# Patient Record
Sex: Male | Born: 1950 | Race: White | Hispanic: No | Marital: Married
Health system: Southern US, Community
[De-identification: ages and names within clinical notes are randomized; demographics above are authoritative.]

---

## 2019-04-27 ENCOUNTER — Inpatient Hospital Stay
Admission: EM | Admit: 2019-04-27 | Discharge: 2019-05-11 | DRG: 296 | Disposition: E | Payer: Medicare HMO | Attending: Pulmonary Disease | Admitting: Pulmonary Disease

## 2019-04-27 ENCOUNTER — Inpatient Hospital Stay: Payer: Medicare HMO

## 2019-04-27 ENCOUNTER — Emergency Department: Payer: Medicare HMO

## 2019-04-27 ENCOUNTER — Encounter: Admission: EM | Disposition: E | Payer: Self-pay | Source: Home / Self Care | Attending: Pulmonary Disease

## 2019-04-27 DIAGNOSIS — J9602 Acute respiratory failure with hypercapnia: Secondary | ICD-10-CM | POA: Diagnosis present

## 2019-04-27 DIAGNOSIS — I63512 Cerebral infarction due to unspecified occlusion or stenosis of left middle cerebral artery: Secondary | ICD-10-CM | POA: Diagnosis present

## 2019-04-27 DIAGNOSIS — G931 Anoxic brain damage, not elsewhere classified: Secondary | ICD-10-CM | POA: Diagnosis present

## 2019-04-27 DIAGNOSIS — E874 Mixed disorder of acid-base balance: Secondary | ICD-10-CM | POA: Diagnosis present

## 2019-04-27 DIAGNOSIS — I2510 Atherosclerotic heart disease of native coronary artery without angina pectoris: Secondary | ICD-10-CM | POA: Diagnosis present

## 2019-04-27 DIAGNOSIS — N179 Acute kidney failure, unspecified: Secondary | ICD-10-CM | POA: Diagnosis present

## 2019-04-27 DIAGNOSIS — I509 Heart failure, unspecified: Secondary | ICD-10-CM | POA: Diagnosis present

## 2019-04-27 DIAGNOSIS — J9600 Acute respiratory failure, unspecified whether with hypoxia or hypercapnia: Secondary | ICD-10-CM

## 2019-04-27 DIAGNOSIS — Z951 Presence of aortocoronary bypass graft: Secondary | ICD-10-CM

## 2019-04-27 DIAGNOSIS — I959 Hypotension, unspecified: Secondary | ICD-10-CM | POA: Diagnosis present

## 2019-04-27 DIAGNOSIS — Z20822 Contact with and (suspected) exposure to covid-19: Secondary | ICD-10-CM | POA: Diagnosis present

## 2019-04-27 DIAGNOSIS — J9601 Acute respiratory failure with hypoxia: Secondary | ICD-10-CM | POA: Diagnosis present

## 2019-04-27 DIAGNOSIS — E669 Obesity, unspecified: Secondary | ICD-10-CM | POA: Diagnosis present

## 2019-04-27 DIAGNOSIS — I110 Hypertensive heart disease with heart failure: Secondary | ICD-10-CM | POA: Diagnosis present

## 2019-04-27 DIAGNOSIS — Z66 Do not resuscitate: Secondary | ICD-10-CM | POA: Diagnosis present

## 2019-04-27 DIAGNOSIS — I469 Cardiac arrest, cause unspecified: Principal | ICD-10-CM | POA: Diagnosis present

## 2019-04-27 DIAGNOSIS — I213 ST elevation (STEMI) myocardial infarction of unspecified site: Secondary | ICD-10-CM

## 2019-04-27 LAB — BASIC METABOLIC PANEL
Anion gap: 21 — ABNORMAL HIGH (ref 5–15)
BUN: 13 mg/dL (ref 8–23)
CO2: 15 mmol/L — ABNORMAL LOW (ref 22–32)
Calcium: 8.7 mg/dL — ABNORMAL LOW (ref 8.9–10.3)
Chloride: 106 mmol/L (ref 98–111)
Creatinine, Ser: 1.47 mg/dL — ABNORMAL HIGH (ref 0.61–1.24)
GFR calc Af Amer: 56 mL/min — ABNORMAL LOW (ref >60)
GFR calc non Af Amer: 49 mL/min — ABNORMAL LOW (ref >60)
Glucose, Bld: 323 mg/dL — ABNORMAL HIGH (ref 70–99)
Potassium: 4.7 mmol/L (ref 3.5–5.1)
Sodium: 142 mmol/L (ref 135–145)

## 2019-04-27 LAB — APTT: aPTT: 69 seconds — ABNORMAL HIGH (ref 24–36)

## 2019-04-27 LAB — BLOOD GAS, ARTERIAL
Acid-base deficit: 20.9 mmol/L — ABNORMAL HIGH (ref 0.0–2.0)
Bicarbonate: 11.8 mmol/L — ABNORMAL LOW (ref 20.0–28.0)
FIO2: 1
MECHVT: 600 mL
Mechanical Rate: 18
O2 Saturation: 85.8 %
PEEP: 5 cmH2O
Patient temperature: 37
RATE: 18 resp/min
pCO2 arterial: 56 mmHg — ABNORMAL HIGH (ref 32.0–48.0)
pH, Arterial: 6.93 — CL (ref 7.350–7.450)
pO2, Arterial: 83 mmHg (ref 83.0–108.0)

## 2019-04-27 LAB — CBC WITH DIFFERENTIAL/PLATELET
Abs Immature Granulocytes: 1.05 10*3/uL — ABNORMAL HIGH (ref 0.00–0.07)
Basophils Absolute: 0.2 10*3/uL — ABNORMAL HIGH (ref 0.0–0.1)
Basophils Relative: 1 %
Eosinophils Absolute: 0.2 10*3/uL (ref 0.0–0.5)
Eosinophils Relative: 1 %
HCT: 47.6 % (ref 39.0–52.0)
Hemoglobin: 14.3 g/dL (ref 13.0–17.0)
Immature Granulocytes: 7 %
Lymphocytes Relative: 35 %
Lymphs Abs: 5.6 10*3/uL — ABNORMAL HIGH (ref 0.7–4.0)
MCH: 28.8 pg (ref 26.0–34.0)
MCHC: 30 g/dL (ref 30.0–36.0)
MCV: 96 fL (ref 80.0–100.0)
Monocytes Absolute: 1 10*3/uL (ref 0.1–1.0)
Monocytes Relative: 6 %
Neutro Abs: 8 10*3/uL — ABNORMAL HIGH (ref 1.7–7.7)
Neutrophils Relative %: 50 %
Platelets: 217 10*3/uL (ref 150–400)
RBC: 4.96 MIL/uL (ref 4.22–5.81)
RDW: 13.7 % (ref 11.5–15.5)
Smear Review: NORMAL
WBC: 15.7 10*3/uL — ABNORMAL HIGH (ref 4.0–10.5)
nRBC: 0.6 % — ABNORMAL HIGH (ref 0.0–0.2)

## 2019-04-27 LAB — LACTIC ACID, PLASMA: Lactic Acid, Venous: >11 mmol/L (ref 0.5–1.9)

## 2019-04-27 LAB — PROTIME-INR
INR: 2.3 — ABNORMAL HIGH (ref 0.8–1.2)
INR: 2.6 — ABNORMAL HIGH (ref 0.8–1.2)
Prothrombin Time: 25.6 seconds — ABNORMAL HIGH (ref 11.4–15.2)
Prothrombin Time: 27.4 seconds — ABNORMAL HIGH (ref 11.4–15.2)

## 2019-04-27 LAB — TROPONIN I (HIGH SENSITIVITY)
Troponin I (High Sensitivity): 45 ng/L — ABNORMAL HIGH (ref <18)
Troponin I (High Sensitivity): 315 ng/L (ref <18)

## 2019-04-27 LAB — COMPREHENSIVE METABOLIC PANEL
ALT: 32 U/L (ref 0–44)
AST: 52 U/L — ABNORMAL HIGH (ref 15–41)
Albumin: 3.8 g/dL (ref 3.5–5.0)
Alkaline Phosphatase: 105 U/L (ref 38–126)
Anion gap: 19 — ABNORMAL HIGH (ref 5–15)
BUN: 12 mg/dL (ref 8–23)
CO2: 14 mmol/L — ABNORMAL LOW (ref 22–32)
Calcium: 9.1 mg/dL (ref 8.9–10.3)
Chloride: 106 mmol/L (ref 98–111)
Creatinine, Ser: 1.13 mg/dL (ref 0.61–1.24)
GFR calc Af Amer: >60 mL/min (ref >60)
GFR calc non Af Amer: >60 mL/min (ref >60)
Glucose, Bld: 260 mg/dL — ABNORMAL HIGH (ref 70–99)
Potassium: 4.1 mmol/L (ref 3.5–5.1)
Sodium: 139 mmol/L (ref 135–145)
Total Bilirubin: 1.1 mg/dL (ref 0.3–1.2)
Total Protein: 7.3 g/dL (ref 6.5–8.1)

## 2019-04-27 LAB — RESPIRATORY PANEL BY RT PCR (FLU A&B, COVID)
Influenza A by PCR: NEGATIVE
Influenza B by PCR: NEGATIVE
SARS Coronavirus 2 by RT PCR: NEGATIVE

## 2019-04-27 LAB — PROCALCITONIN: Procalcitonin: 2.44 ng/mL

## 2019-04-27 SURGERY — CORONARY/GRAFT ACUTE MI REVASCULARIZATION
Anesthesia: Moderate Sedation

## 2019-04-27 MED ORDER — VANCOMYCIN HCL IN DEXTROSE 1-5 GM/200ML-% IV SOLN: 1000.0000 mg | Freq: Once | INTRAVENOUS | Status: DC

## 2019-04-27 MED ORDER — ASPIRIN 300 MG RE SUPP: 300.0000 mg | RECTAL | Status: DC

## 2019-04-27 MED ORDER — HEPARIN (PORCINE) 25000 UT/250ML-% IV SOLN: 1300.0000 [IU]/h | INTRAVENOUS | Status: DC

## 2019-04-27 MED ORDER — SODIUM CHLORIDE 0.9 % IV SOLN: INTRAVENOUS | Status: DC

## 2019-04-27 MED ORDER — STERILE WATER FOR INJECTION IV SOLN
INTRAVENOUS | Status: DC
  Filled 2019-04-27 (×3): qty 850

## 2019-04-27 MED ORDER — SODIUM BICARBONATE 8.4 % IV SOLN
100.0000 meq | Freq: Once | INTRAVENOUS | Status: AC
  Administered 2019-04-27: 100 meq via INTRAVENOUS
  Filled 2019-04-27: qty 50

## 2019-04-27 MED ORDER — FENTANYL CITRATE (PF) 100 MCG/2ML IJ SOLN
50.0000 ug | Freq: Once | INTRAMUSCULAR | Status: AC
  Administered 2019-04-27: 50 ug via INTRAVENOUS
  Filled 2019-04-27: qty 2

## 2019-04-27 MED ORDER — FENTANYL BOLUS VIA INFUSION
25.0000 ug | INTRAVENOUS | Status: DC | PRN
  Filled 2019-04-27: qty 25

## 2019-04-27 MED ORDER — PIPERACILLIN-TAZOBACTAM 3.375 G IVPB 30 MIN: 3.3750 g | Freq: Once | INTRAVENOUS | Status: DC

## 2019-04-27 MED ORDER — INSULIN REGULAR(HUMAN) IN NACL 100-0.9 UT/100ML-% IV SOLN: INTRAVENOUS | Status: DC

## 2019-04-27 MED ORDER — DEXTROSE 50 % IV SOLN: 0.0000 mL | INTRAVENOUS | Status: DC | PRN

## 2019-04-27 MED ORDER — INSULIN ASPART 100 UNIT/ML ~~LOC~~ SOLN: 0.0000 [IU] | SUBCUTANEOUS | Status: DC

## 2019-04-27 MED ORDER — NOREPINEPHRINE 4 MG/250ML-% IV SOLN
60.0000 ug/min | INTRAVENOUS | Status: DC
  Administered 2019-04-27: 60 ug/min via INTRAVENOUS

## 2019-04-27 MED ORDER — NOREPINEPHRINE 4 MG/250ML-% IV SOLN
INTRAVENOUS | Status: AC
  Filled 2019-04-27: qty 250

## 2019-04-27 MED ORDER — HYDROCORTISONE NA SUCCINATE PF 100 MG IJ SOLR
50.0000 mg | Freq: Four times a day (QID) | INTRAMUSCULAR | Status: DC
  Administered 2019-04-27: 50 mg via INTRAVENOUS
  Filled 2019-04-27: qty 2

## 2019-04-27 MED ORDER — FAMOTIDINE IN NACL 20-0.9 MG/50ML-% IV SOLN: 20.0000 mg | Freq: Two times a day (BID) | INTRAVENOUS | Status: DC

## 2019-04-27 MED ORDER — PROPOFOL 1000 MG/100ML IV EMUL: INTRAVENOUS | Status: DC

## 2019-04-27 MED ORDER — VASOPRESSIN 20 UNIT/ML IV SOLN
0.0300 [IU]/min | INTRAVENOUS | Status: DC
  Administered 2019-04-27: 0.03 [IU]/min via INTRAVENOUS
  Filled 2019-04-27: qty 2

## 2019-04-27 MED ORDER — NOREPINEPHRINE 16 MG/250ML-% IV SOLN
0.0000 ug/min | INTRAVENOUS | Status: DC
  Administered 2019-04-27: 2 ug/min via INTRAVENOUS
  Filled 2019-04-27: qty 250

## 2019-04-27 MED ORDER — FENTANYL 2500MCG IN NS 250ML (10MCG/ML) PREMIX INFUSION: 100.0000 ug/h | INTRAVENOUS | Status: DC

## 2019-04-28 MED ORDER — CALCIUM CHLORIDE 10 % IV SOLN
INTRAVENOUS | Status: AC | PRN
  Administered 2019-04-27: 1 g via INTRAVENOUS

## 2019-04-28 MED ORDER — ATROPINE SULFATE 1 MG/ML IJ SOLN
INTRAMUSCULAR | Status: AC | PRN
  Administered 2019-04-27: 1 mg via INTRAVENOUS

## 2019-04-28 MED ORDER — NOREPINEPHRINE 4 MG/250ML-% IV SOLN
INTRAVENOUS | Status: AC | PRN
  Administered 2019-04-27: 1 ug/min via INTRAVENOUS

## 2019-04-28 MED ORDER — EPINEPHRINE 1 MG/10ML IJ SOSY
PREFILLED_SYRINGE | INTRAMUSCULAR | Status: AC | PRN
  Administered 2019-04-27 (×3): 1 via INTRAVENOUS

## 2019-04-28 MED ORDER — SODIUM BICARBONATE 8.4 % IV SOLN
INTRAVENOUS | Status: AC | PRN
  Administered 2019-04-27: 50 meq via INTRAVENOUS

## 2019-04-28 NOTE — ED Notes (Signed)
Patient's wallet, shoes and another zippered case given to patient's wife and daughter prior to their leaving the department.

## 2019-05-11 NOTE — ED Triage Notes (Signed)
Per EMS report, Patient went into cardiac arrest at a baseball game which was witnessed. CPR started by Careers adviser. EMT states the patient was in V. Fib and was defibrillated once. Patient was given per EMT 3 amps of Epi, 4 mag, 300 Amniodarone. ROSC was achieved and patient was placed on 150 Amniodarone drip. Patient went into cardiac arrest again and arrived with CPR arrest.

## 2019-05-11 NOTE — ED Notes (Signed)
ROSC obtained. 1 amp of Epinephrine administered by [employer]

## 2019-05-11 NOTE — ED Notes (Signed)
1 amp of Bicarb administered by [employer]. 2nd liter of NS hung.

## 2019-05-11 NOTE — ED Notes (Signed)
Per EMT report, patient was paced with capture before re-arrest in the field.   1 L NS infusing via right AC.

## 2019-05-11 NOTE — ED Notes (Signed)
Norepi started in right [facility] by Shanda Bumps RN at 20.

## 2019-05-11 NOTE — ED Notes (Signed)
Sedation medications held per Harlon Ditty NP due to patient being unresponsive to pain until after other infusions were started unless patient needs them for sedation.

## 2019-05-11 NOTE — ED Notes (Signed)
Taken to CT with RT and Melody ED tech

## 2019-05-11 NOTE — ED Notes (Addendum)
Patient is in PEA per Dr. Silverio Lay.

## 2019-05-11 NOTE — ED Notes (Signed)
Family brought back to family room and given ice water and tissues to wait until patient is done with CT.

## 2019-05-11 NOTE — ED Notes (Signed)
1 amp Epi given by [employer] via right hand

## 2019-05-11 NOTE — ED Notes (Signed)
Patient's wife and daughter at bedside. Family was informed of death by Harlon Ditty NP. Chaplain was called. RT was called to remove vent.

## 2019-05-11 NOTE — ED Notes (Signed)
Chaplain at bedside

## 2019-05-11 NOTE — Progress Notes (Signed)
ANTICOAGULATION CONSULT NOTE - Initial Consult  Pharmacy Consult for Heparin  Indication: chest pain/ACS  Not on File  Patient Measurements:   Heparin Dosing Weight: 92.7 kg   Vital Signs: Temp: 96.1 F (35.6 C) (04/17 2015) BP: 88/62 (04/17 2015) Pulse Rate: 82 (04/17 2015)  Labs: Recent Labs    May 23, 2019 1849  HGB 14.3  HCT 47.6  PLT 217  LABPROT 25.6*  INR 2.3*  CREATININE 1.13  TROPONINIHS 45*    CrCl cannot be calculated (Unknown ideal weight.).   Medical History: No past medical history on file.  Medications:  (Not in a hospital admission)   Assessment: Pharmacy consulted to dose heparin in this 69 year old male admitted with STEMI/cardiac arrest.  EKG does not indicate STEMI ,  MD wants to continue with heparin nonetheless.    No prior anticoag noted.  Per RN :   5'3" and 315 lbs  Goal of Therapy:  Heparin level 0.3-0.7 units/ml Monitor platelets by anticoagulation protocol: Yes   Plan:  Heparin 5000 units given in ED @ 1920. Will order Heparin 1300 units/hr . Will order HL 6 hrs after start of drip.   Brendan Morris D 2019/05/23,9:04 PM

## 2019-05-11 NOTE — ED Notes (Signed)
CPR in progress, Dr. Silverio Lay in attendance. Also present: Rodell Perna RN Chrissy RN This writer [employer] control surveyor ED tech [facility] ED tech.  1 amp of epi given via IO in left lower leg. Patient has Brooke Dare airway in place with blood noted in oral cavity. EMS ambu bag used for respirations. RT paged.

## 2019-05-11 NOTE — ED Notes (Signed)
1 amp of bicarb given by Shanda Bumps RN in right ac.

## 2019-05-11 NOTE — ED Notes (Signed)
Dr. Silverio Lay intubated patient. RT arrived in room. CPR still in progress.

## 2019-05-11 NOTE — ED Notes (Signed)
Time of death called by Harlon Ditty, NP.

## 2019-05-11 NOTE — ED Notes (Signed)
CACl administered by Chrissy RN via right AC. 1amp Bicarb given via IO by [employer]

## 2019-05-11 NOTE — ED Notes (Signed)
ROSC achieved 

## 2019-05-11 NOTE — ED Notes (Signed)
[facility] called with referral. [phone] Spoke with Maralyn Sago. Pt is donor appropriate for tissue and eyes.  Ref # [id number]

## 2019-05-11 NOTE — ED Notes (Signed)
Narrative report of code from written account by this RN.

## 2019-05-11 NOTE — ED Notes (Signed)
Rhythm check per Dr. Silverio Lay. PEA

## 2019-05-11 NOTE — H&P (Signed)
PULMONARY / CRITICAL CARE MEDICINE   Name: Brendan Morris MRN: [id number] DOB: [birth year]    ADMISSION DATE:  05/02/19 CONSULTATION DATE:  05-02-2019  REFERRING MD:  Dr. Darl Householder   CHIEF COMPLAINT:  Cardiac arrest  BRIEF DISCUSSION: 69 year old male who presented 4/17 status post out of hospital cardiac arrest (V. fib arrest of approximately 30 minutes duration befor ROSC).  Unclear etiology of cardiac arrest, initially suspected to be due to STEMI, however did not meet STEMI criteria.  Remained unresponsive, therefore was to undergo targeted temperature management 36 degrees.  Also with cardiogenic and questionable septic shock requiring vasopressors, severe anion gap metabolic acidosis in the setting of lactic acidosis, along with multiorgan failure.  HISTORY OF PRESENT ILLNESS:   Brendan Morris is a 69 year old male with a past medical history significant for CAD status post CABG and stents, and hypertension who presented to [facility] ED on 05/02/2019 due to out-of-hospital cardiac arrest.  Per the patient's wife, the patient had just finished watching a baseball game was walking back to the car when he suddenly collapsed.  Bystandards immediately initiated CPR.  Upon EMS arrival he was found to be in V. fib arrest, of which he was shocked once and given 3 rounds of epi, magnesium and 300 mg of amiodarone.  Total downtime was approximately 30 minutes before ROSC obtained.  EMS placed a King airway in the field.  Upon arrival to the ED he was emergently intubated.  Initial EKG was concerning for possible STEMI.  Dr. Clayborn Bigness evaluated patient and EKG, did not meet STEMI criteria, therefore it was not recommended he undergo emergent cardiac catheterization, but that the patient be managed with heparin drip.  While in the ED he suffered another brief cardiac arrest, approximately 5 minutes in duration.  Initial work work-up in the ED revealed bicarb 14, glucose 260, creatinine 1.13, anion gap 19, AST 52,  high-sensitivity troponin 45, WBC 15.7 (with neutrophilia), INR 2.3, prothrombin time 25.6.  His SARS-CoV-2 PCR is negative, influenza PCR is negative.  Chest x-ray was consistent with pulmonary edema and CHF, no evidence of infiltrate.  ABG status post intubation with both severe respiratory and metabolic acidosis (pH 7.61 / CO2 56 / O2 83 / Bicarb 11.8).  He was noted to be severely hypotensive, therefore central line was placed by ED physician he was placed on Levophed infusion.  Further work-up revealed lactic acid greater than 11 and procalcitonin 2.44.  He was placed on heparin drip, received 2 amps of bicarb along with bicarb drip, and was to receive broad-spectrum coverage with vancomycin and Zosyn.  ICU was asked to admit the patient for further work-up and treatment of cardiac arrest of unclear etiology.  Upon my assessment in the ED, patient was unresponsive on no sedation, pupils fixed and dilated, with absent cough or gag reflexes.  Given his unresponsiveness he was to be placed on targeted temperature management at 36 C.  CT head without contrast is pending prior to initiation of TTM.    Had long discussion with patient's wife and daughter at bedside regarding the course of his cardiac arrest and poor prognosis.  They state that the patient would not want to be maintained artificially nor would want to live without a decent quality of life.  They both agree and consent to DNR/DNI status, and would like to continue with aggressive medical treatment and initiation of TTM for the next 24 to 48 hours.     CT head obtained which revealed no acute intracranial  hemorrhage, but was concerning for early hypoxic/ischemic encephalopathy predominantly involving the left MCA territory.  Shortly after return from CT patient became bradycardic which progressed to PEA.  Patient's wife and daughter were brought to bedside, and patient was pronounced.    PAST MEDICAL HISTORY :  He  has no past medical  history on file.  PAST SURGICAL HISTORY: He  has no past surgical history on file.  Not on File  No current facility-administered medications on file prior to encounter.   No current outpatient medications on file prior to encounter.    FAMILY HISTORY:  His has no family status information on file.    SOCIAL HISTORY: He      COVID-19 DISASTER DECLARATION:  FULL CONTACT PHYSICAL EXAMINATION WAS NOT POSSIBLE DUE TO TREATMENT OF COVID-19 AND  CONSERVATION OF PERSONAL PROTECTIVE EQUIPMENT, LIMITED EXAM FINDINGS INCLUDE-  Patient assessed or the symptoms described in the history of present illness.  In the context of the Global COVID-19 pandemic, which necessitated consideration that the patient might be at risk for infection with the SARS-CoV-2 virus that causes COVID-19, Institutional protocols and algorithms that pertain to the evaluation of patients at risk for COVID-19 are in a state of rapid change based on information released by regulatory bodies including the CDC and federal and state organizations. These policies and algorithms were followed during the patient's care while in hospital.   REVIEW OF SYSTEMS:   Unable to obtain due to critical illness and intubation  SUBJECTIVE:  Unable to obtain due to critical illness and intubation  VITAL SIGNS: BP (!) 88/62   Pulse 82   Temp (!) 96.1 F (35.6 C)   Resp 18   SpO2 (!) 86%   HEMODYNAMICS:    VENTILATOR SETTINGS: Vent Mode: AC FiO2 (%):  [100 %] 100 % Set Rate:  [18 bmp] 18 bmp Vt Set:  [500 mL] 500 mL PEEP:  [5 cmH20] 5 cmH20  INTAKE / OUTPUT: No intake/output data recorded.  PHYSICAL EXAMINATION: General: Critically ill-appearing male, intubated, unresponsive on no sedation, no acute distress Neuro: Unresponsive on no sedation, pupils fixed and dilated, absent cough and gag reflexes HEENT: Atraumatic, normocephalic, neck supple Cardiovascular: Irregular regular rhythm Lungs: Coarse breath sounds  throughout, vent assisted, even, nonlabored Abdomen: Obese, soft, nontender, nondistended, no guarding or rebound tenderness Musculoskeletal: No deformities, 1+ edema lateral lower extremities Skin: Cool and dry.  No obvious rashes, lesions, ulcerations  LABS:  BMET Recent Labs  Lab 2019-05-19 1849  NA 139  K 4.1  CL 106  CO2 14*  BUN 12  CREATININE 1.13  GLUCOSE 260*    Electrolytes Recent Labs  Lab 05-19-19 1849  CALCIUM 9.1    CBC Recent Labs  Lab 2019-05-19 1849  WBC 15.7*  HGB 14.3  HCT 47.6  PLT 217    Coag's Recent Labs  Lab 2019/05/19 1849  INR 2.3*    Sepsis Markers No results for input(s): LATICACIDVEN, PROCALCITON, O2SATVEN in the last 168 hours.  ABG Recent Labs  Lab 05-19-19 1938  PHART 6.93*  PCO2ART 56*  PO2ART 83    Liver Enzymes Recent Labs  Lab 05-19-2019 1849  AST 52*  ALT 32  ALKPHOS 105  BILITOT 1.1  ALBUMIN 3.8    Cardiac Enzymes No results for input(s): TROPONINI, PROBNP in the last 168 hours.  Glucose No results for input(s): GLUCAP in the last 168 hours.  Imaging DG Chest Portable 1 View  Result Date: May 19, 2019 CLINICAL DATA:  Intubated, central line  placement EXAM: PORTABLE CHEST 1 VIEW COMPARISON:  None. FINDINGS: 2 frontal views of the chest are obtained, limited by portable technique, supine position, and patient body habitus. Endotracheal tube overlies tracheal air column, tip projecting approximately 1.3 cm above carina. Right internal jugular catheter tip overlies right brachiocephalic confluence. There are abandoned epicardial pacing wires. 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 The heart is enlarged. There is diffuse ground-glass airspace disease and bilateral vascular congestion consistent with pulmonary edema. No large effusion or pneumothorax. IMPRESSION: 1. Support devices as above. 2. Findings consistent with congestive heart failure and pulmonary edema. Electronically Signed   By: Randa Ngo M.D.    On: 05-15-2019 19:23     STUDIES:  4/17: CXR>> 2 frontal views of the chest are obtained, limited by portable technique, supine position, and patient body habitus. Endotracheal tube overlies tracheal air column, tip projecting approximately 1.3 cm above carina. Right internal jugular catheter tip overlies right brachiocephalic confluence. There are abandoned epicardial pacing wires. 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 The heart is enlarged. There is diffuse ground-glass airspace disease and bilateral vascular congestion consistent with pulmonary edema. No large effusion or pneumothorax. 4/17: CT Head w/o Contrast>>1. No acute intracranial hemorrhage. 2. 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 Clinical correlation is recommended. 4/18: 2D Echocardiogram>>  CULTURES: SARS-CoV-2 PCR 4/17>> negative Influenza PCR 4/17>> negative MRSA PCR 4/17>> Blood culture x2 4/17>> Tracheal aspirate 4/17>> Urine 4/17>>   ANTIBIOTICS: Vancomycin 4/17>> Zosyn 4/17>>  SIGNIFICANT EVENTS: 4/17: Presents to ED post cardiac arrest 4/17: Admission to ICU, toinitiate targeted temperature management 36 degrees 4/17: CT Head with evidence of anoxic brain injury vs. Infarction 4/17: Shortly after return from CT, bradycardic progressing to PEA  LINES/TUBES: ETT 4/17>> Right IJ CVC 4/17>>   ASSESSMENT / PLAN:  PULMONARY A: Acute hypoxic hypercapnic respiratory failure in the setting of cardiac arrest P:   -Full vent support -Wean FiO2 and PEEP as tolerated to maintain O2 sats greater than 92% -Follow intermittent chest x-ray and ABG as needed -VAP protocol -Spontaneous breathing trials when respiratory parameters met and mental status permits -PRN bronchodilators   CARDIOVASCULAR A:  Cardiac arrest of unclear etiology Cardiogenic shock +/- septic shock Acute Decompensated CHF  P:   -Continuous cardiac monitoring -Maintain MAP greater than 65 -Cautious IV fluids given acute CHF -Vasopressors as needed to maintain MAP goal -Stress dose steroids -Cardiology consulted, discussed with Dr. Clayborn Bigness who feels patient does not meet STEMI criteria and does not need emergent cardiac catheterization at this time, does recommend continuing heparin drip for 24 to 48 hours, and Cardiology will reevaluate for CATH daily -2D echocardiogram pending -Trend troponin until downtrending -Unable to diurese at this time due to hypotension -Trend lactic acid  RENAL A:   AKI Anion Gap Metabolic Acidosis in setting of Lactic acidosis P:   -Monitor I&O's / urinary output -Follow BMP -Ensure adequate renal perfusion -Avoid nephrotoxic agents as able -Replace electrolytes as indicated -Bicarb drip  GASTROINTESTINAL A:   No acute issues P:   -N.p.o. -Pepcid IV for stress ulcer prophylaxis -OG tube to low intermittent suction  HEMATOLOGIC A:   No acute issues P:  -Monitor for S/Sx of bleeding -Trend CBC -Heparin drip for Anticoagulation /VTE Prophylaxis  -Transfuse for Hgb <7   INFECTIOUS A:   Leukocytosis, likely in setting of cardiac arrest (no obvious source of infection identified to date) ~ however Procalcitonin 2.44 P:   -Monitor fever curve -Trend  WBCs -Check procalcitonin ~ 2.44 -Follow cultures as above -Chest x-ray without obvious infiltrate -Urinalysis is pending -Place on empiric Vancomycin & Zosyn for now  ENDOCRINE A:   Hyperglycemia   P:   -CBGs -Insulin gtt -Follow ICU hypo/hyperglycemia protocol  NEUROLOGIC A:   Unresponsive post cardiac arrest, concern for possible anoxic brain injury P:   -CT head pending -Initiate targeted temperature management at 36 C -RASS goal: -4 to -5 with TTM -Fentanyl and propofol drips as needed to maintain RASS goal -Daily wake-up assessment once TTM complete -Consult Neurology, appreciate  input -EEG   FAMILY  - Updates: Updated patient's wife and daughter at bedside.  They understand his poor prognosis.  They are agreeable to and consent to DNR/DNI status. Would like to try aggressive measures and TTM for the next 24-48 hours and reevaluate prognosis at that time.  - Inter-disciplinary family meet or Palliative Care meeting due by:  05/04/2019    Darel Hong, AGACNP-BC [facility] Pager: [phone]  05/07/2019, 8:47 PM

## 2019-05-11 NOTE — ED Notes (Addendum)
Patient became bradycardic after returning from CT scan. Dr. Silverio Lay and Jeri Modena NP aware. Dr. Scotty Court at bedside.

## 2019-05-11 NOTE — ED Notes (Addendum)
CPR in progress. Patient bagged by Dr.  Silverio Lay.

## 2019-05-11 NOTE — ED Notes (Signed)
Triple lumen central line placed in right IJ

## 2019-05-11 NOTE — ED Notes (Signed)
Patient's wife and daughter at bedside. Jeri Modena NP also present.

## 2019-05-11 NOTE — ED Notes (Addendum)
CPR stopped. Pulse palpated in femoral and radial arteries.  NS hung.

## 2019-05-11 NOTE — ED Notes (Signed)
Dr. Callwood at bedside. °

## 2019-05-11 NOTE — ED Notes (Addendum)
Jeri Modena NP informed of Lactic of over 11. 1 amp of Bicarb administered by [employer] RN

## 2019-05-11 NOTE — ED Notes (Signed)
RT at bedside to adjust ETT tube.

## 2019-05-11 NOTE — ED Notes (Signed)
Norepinephrine started at 50mcg/min per Dr. Silverio Lay. Medication mixed in bag by [employer]

## 2019-05-11 NOTE — ED Notes (Addendum)
Jeri Modena NP from ICU informed of Troponin of 315 and Lactic over 11.

## 2019-05-11 NOTE — ED Provider Notes (Signed)
[facility] EMERGENCY DEPARTMENT Provider Note   CSN: [id number] Arrival date & time: 27-May-2019  1826     History Chief Complaint  Patient presents with  . CPR    Brendan Morris is a 69 y.o. male history of CAD status post CABG and stents, hypertension, here presenting with cardiac arrest .  Per the wife, patient just finished watching a baseball game and was walking to the car and suddenly collapsed.  EMS noticed V. fib arrest and patient was shocked and given 3 rounds of epi and magnesium and 300 mg of amiodarone.  Patient also was given another 150 mg of amiodarone eventually.  Total downtime was about 30 minutes. Patient also had a King airway in place on arrival as well as left tibial IO.  The history is provided by the spouse and the EMS personnel.  Level V caveat- cardiac arrest      No past medical history on file.  Patient Active Problem List   Diagnosis Date Noted  . Cardiac arrest ([place]) May 27, 2019      No family history on file.  Social History   Tobacco Use  . Smoking status: Not on file  Substance Use Topics  . Alcohol use: Not on file  . Drug use: Not on file    Home Medications Prior to Admission medications   Not on File    Allergies    Patient has no allergy information on record.  Review of Systems   Review of Systems  Unable to perform ROS: Intubated  All other systems reviewed and are negative.   Physical Exam Updated Vital Signs BP (!) 88/62   Pulse 82   Temp (!) 96.1 F (35.6 C)   Resp 18   SpO2 (!) 86%   Physical Exam Vitals and nursing note reviewed.  Constitutional:      Comments: Obese, lethargic   HENT:     Head: Atraumatic.     Mouth/Throat:     Mouth: Mucous membranes are dry.     Comments: Blood in the mouth  Eyes:     Extraocular Movements: Extraocular movements intact.  Cardiovascular:     Comments: Asystolic  Pulmonary:     Comments: Ventilated  Abdominal:     General: Abdomen is flat.    Musculoskeletal:        General: Normal range of motion.     Cervical back: Normal range of motion.  Skin:    General: Skin is warm.     Capillary Refill: Capillary refill takes less than 2 seconds.  Neurological:     Comments: No spontaneous movement   Psychiatric:     Comments: Unable      ED Results / Procedures / Treatments   Labs (all labs ordered are listed, but only abnormal results are displayed) Labs Reviewed  COMPREHENSIVE METABOLIC PANEL - Abnormal; Notable for the following components:      Result Value   CO2 14 (*)    Glucose, Bld 260 (*)    AST 52 (*)    Anion gap 19 (*)    All other components within normal limits  CBC WITH DIFFERENTIAL/PLATELET - Abnormal; Notable for the following components:   WBC 15.7 (*)    nRBC 0.6 (*)    Neutro Abs 8.0 (*)    Lymphs Abs 5.6 (*)    Basophils Absolute 0.2 (*)    Abs Immature Granulocytes 1.05 (*)    All other components within normal limits  PROTIME-INR -  Abnormal; Notable for the following components:   Prothrombin Time 25.6 (*)    INR 2.3 (*)    All other components within normal limits  BLOOD GAS, ARTERIAL - Abnormal; Notable for the following components:   pH, Arterial 6.93 (*)    pCO2 arterial 56 (*)    Bicarbonate 11.8 (*)    Acid-base deficit 20.9 (*)    All other components within normal limits  TROPONIN I (HIGH SENSITIVITY) - Abnormal; Notable for the following components:   Troponin I (High Sensitivity) 45 (*)    All other components within normal limits  RESPIRATORY PANEL BY RT PCR (FLU A&B, COVID)  URINALYSIS, COMPLETE (UACMP) WITH MICROSCOPIC  RAPID URINE DRUG SCREEN, HOSP PERFORMED  LACTIC ACID, PLASMA  LACTIC ACID, PLASMA  PROCALCITONIN  BASIC METABOLIC PANEL  BASIC METABOLIC PANEL  PROTIME-INR  APTT  BLOOD GAS, ARTERIAL  BLOOD GAS, ARTERIAL  CBC  BLOOD GAS, ARTERIAL  TROPONIN I (HIGH SENSITIVITY)    EKG EKG Interpretation  Date/Time:  Saturday 07-May-2019 19:10:54  EDT Ventricular Rate:  74 PR Interval:    QRS Duration: 145 QT Interval:  375 QTC Calculation: 416 R Axis:   43 Text Interpretation: Atrial fibrillation IVCD, consider atypical RBBB Anterior infarct, old Borderline ST depression, lateral leads STEMI in previous EKG improved Confirmed by Richardean Canal [phone]) on 05-07-19 7:22:06 PM   Radiology DG Chest Portable 1 View  Result Date: 05-07-2019 CLINICAL DATA:  Intubated, central line placement EXAM: PORTABLE CHEST 1 VIEW COMPARISON:  None. FINDINGS: 2 frontal views of the chest are obtained, limited by portable technique, supine position, and patient body habitus. Endotracheal tube overlies tracheal air column, tip projecting approximately 1.3 cm above carina. Right internal jugular catheter tip overlies right brachiocephalic confluence. There are abandoned epicardial pacing wires. 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 The heart is enlarged. There is diffuse ground-glass airspace disease and bilateral vascular congestion consistent with pulmonary edema. No large effusion or pneumothorax. IMPRESSION: 1. Support devices as above. 2. Findings consistent with congestive heart failure and pulmonary edema. Electronically Signed   By: Sharlet Salina M.D.   On: May 07, 2019 19:23    Procedures Procedures (including critical care time)  CRITICAL CARE Performed by: Richardean Canal   Total critical care time: 45 minutes  Critical care time was exclusive of separately billable procedures and treating other patients.  Critical care was necessary to treat or prevent imminent or life-threatening deterioration.  Critical care was time spent personally by me on the following activities: development of treatment plan with patient and/or surrogate as well as nursing, discussions with consultants, evaluation of patient's response to treatment, examination of patient, obtaining history from patient or surrogate, ordering and performing treatments and  interventions, ordering and review of laboratory studies, ordering and review of radiographic studies, pulse oximetry and re-evaluation of patient's condition.  Cardiopulmonary Resuscitation (CPR) Procedure Note Directed/Performed by: Richardean Canal I personally directed ancillary staff and/or performed CPR in an effort to regain return of spontaneous circulation and to maintain cardiac, neuro and systemic perfusion.   INTUBATION Performed by: Richardean Canal  Required items: required blood products, implants, devices, and special equipment available Patient identity confirmed: provided demographic data and hospital-assigned identification number Time out: Immediately prior to procedure a "time out" was called to verify the correct patient, procedure, equipment, support staff and site/side marked as required.  Indications: Cardiac arrest  Intubation method: direct Laryngoscopy   Preoxygenation: BVM  Sedatives: none  Paralytic: none  Tube Size: 7.5 cuffed  Post-procedure assessment: chest rise and ETCO2 monitor Breath sounds: equal and absent over the epigastrium Tube secured with: ETT holder Chest x-ray interpreted by radiologist and me.  Chest x-ray findings: endotracheal tube in appropriate position  Patient tolerated the procedure well with no immediate complications.  CENTRAL LINE Performed by: Richardean Canal Consent: The procedure was performed in an emergent situation. Required items: required blood products, implants, devices, and special equipment available Patient identity confirmed: arm band and provided demographic data Time out: Immediately prior to procedure a "time out" was called to verify the correct patient, procedure, equipment, support staff and site/side marked as required. Indications: vascular access Anesthesia: local infiltration Local anesthetic: lidocaine 1% with epinephrine Anesthetic total: 3 ml Patient sedated: no Preparation: skin prepped with 2%  chlorhexidine Skin prep agent dried: skin prep agent completely dried prior to procedure Sterile barriers: all five maximum sterile barriers used - cap, mask, sterile gown, sterile gloves, and large sterile sheet Hand hygiene: hand hygiene performed prior to central venous catheter insertion  Location details: R IJ  Catheter type: triple lumen Catheter size: 8 Fr Pre-procedure: landmarks identified Ultrasound guidance: yes Successful placement: yes Post-procedure: line sutured and dressing applied Assessment: blood return through all parts, free fluid flow, placement verified by x-ray and no pneumothorax on x-ray Patient tolerance: Patient tolerated the procedure well with no immediate complications.    Medications Ordered in ED Medications  aspirin suppository 300 mg (has no administration in time range)  0.9 %  sodium chloride infusion (has no administration in time range)  fentaNYL (SUBLIMAZE) injection 50 mcg (has no administration in time range)  fentaNYL in NS (24mcg/ml) infusion-PREMIX (has no administration in time range)  fentaNYL (SUBLIMAZE) bolus via infusion 25 mcg (has no administration in time range)  propofol (DIPRIVAN) 1000 MG/100ML infusion (has no administration in time range)  famotidine (PEPCID) IVPB 20 mg premix (has no administration in time range)  norepinephrine (LEVOPHED) 4-5 MG/250ML-% infusion SOLN (has no administration in time range)  sodium bicarbonate injection 100 mEq (has no administration in time range)  sodium bicarbonate 150 mEq in sterile water 1,000 mL infusion (has no administration in time range)    ED Course  I have reviewed the triage vital signs and the nursing notes.  Pertinent labs & imaging results that were available during my care of the patient were reviewed by me and considered in my medical decision making (see chart for details).    MDM Rules/Calculators/A&P                      Noble Cicalese is a 69 y.o. male  is here with status post cardiac arrest.  He was at the ball game and suddenly collapsed.  Bystanders started CPR and total downtime will return 30 minutes.  His initial EKG showed possible STEMI so code STEMI was activated. I talked to Dr. Juliann Pares to see patient in the ED. we will also intubate patient and start pressors.  Additional history obtained:  Additional history obtained from daughter, wife. Previous records obtained and reviewed- none available  Lab Tests:  I Ordered, reviewed, and interpreted labs, which included:   Imaging Studies ordered:  I ordered imaging studies which included CXR, I independently visualized and interpreted imaging which showed pulmonary edema, ET tube in place  Medicines ordered:  I ordered medication levophed, heparin  For hypotension, STEMI    Consultations Obtained: I consulted Dr. Juliann Pares and ICU and  discussed lab and imaging findings  7 pm Patient was hypotensive and started on Levophed drip.  I placed a central line and patient went back into cardiac arrest for about 5 minutes.   7:15 pm Dr. Juliann Pares at bedside.  Second EKG showed no STEMI.  He states that he will eventually take patient up to the Cath Lab but recommend heparin IV bolus and drip for now.  Central line placement was confirmed by chest x-ray.  8:27 PM ICU at bedside to admit the patient.  They will initiate cooling protocol.      Final Clinical Impression(s) / ED Diagnoses Final diagnoses:  Acute respiratory failure [facility])    Rx / DC Orders ED Discharge Orders    None       Charlynne Pander, MD 05/04/2019 2028

## 2019-05-11 NOTE — Death Summary Note (Signed)
DEATH SUMMARY   Patient Details  Name: Brendan Morris MRN: [id number] DOB: February 28, 1950  Admission/Discharge Information   Admit Date:  2019-05-11  Date of Death:  2019/05/11  Time of Death:  23:00  Length of Stay: 0  Referring Physician: Patient, No Pcp Per   Reason(s) for Hospitalization  Cardiac Arrest Cardiogenic shock Acute Decompensated CHF AKI Anion gap metabolic acidosis  Diagnoses  Preliminary cause of death:   Cardiac Arrest Secondary Diagnoses (including complications and co-morbidities):  Active Problems:   Cardiac arrest [facility]) Cardiogenic shock Acute Decompensated CHF AKI Anion gap metabolic acidosis  Brief Hospital Course (including significant findings, care, treatment, and services provided and events leading to death)  Didier Brandenburg is a 69 year old male with a past medical history significant for CAD status post CABG and stents, and hypertension who presented to [facility] ED on May 11, 2019 due to out-of-hospital cardiac arrest.  Per the patient's wife, the patient had just finished watching a baseball game was walking back to the car when he suddenly collapsed.  Bystandards immediately initiated CPR.  Upon EMS arrival he was found to be in V. fib arrest, of which he was shocked once and given 3 rounds of epi, magnesium and 300 mg of amiodarone.  Total downtime was approximately 30 minutes before ROSC obtained.  EMS placed a King airway in the field.  Upon arrival to the ED he was emergently intubated.  Initial EKG was concerning for possible STEMI.  Dr. Juliann Pares evaluated patient and EKG, did not meet STEMI criteria, therefore it was not recommended he undergo emergent cardiac catheterization, but that the patient be managed with heparin drip.  While in the ED he suffered another brief cardiac arrest, approximately 5 minutes in duration.  Initial work work-up in the ED revealed bicarb 14, glucose 260, creatinine 1.13, anion gap 19, AST 52, high-sensitivity troponin 45, WBC  15.7 (with neutrophilia), INR 2.3, prothrombin time 25.6.  His SARS-CoV-2 PCR is negative, influenza PCR is negative.  Chest x-ray was consistent with pulmonary edema and CHF, no evidence of infiltrate.  ABG status post intubation with both severe respiratory and metabolic acidosis (pH 6.93 / CO2 56 / O2 83 / Bicarb 11.8).  He was noted to be severely hypotensive, therefore central line was placed by ED physician he was placed on Levophed infusion.  Further work-up revealed lactic acid greater than 11 and procalcitonin 2.44.  He was placed on heparin drip, received 2 amps of bicarb along with bicarb drip, and was to receive broad-spectrum coverage with vancomycin and Zosyn.  ICU was asked to admit the patient for further work-up and treatment of cardiac arrest of unclear etiology.  Upon my assessment in the ED, patient was unresponsive on no sedation, pupils fixed and dilated, with absent cough or gag reflexes.  Given his unresponsiveness he was to be placed on targeted temperature management at 36 C.  CT head without contrast is pending prior to initiation of TTM.    Had long discussion with patient's wife and daughter at bedside regarding the course of his cardiac arrest and poor prognosis.  They state that the patient would not want to be maintained artificially nor would want to live without a decent quality of life.  They both agree and consent to DNR/DNI status, and would like to continue with aggressive medical treatment and initiation of TTM for the next 24 to 48 hours.     CT head obtained which revealed no acute intracranial hemorrhage, but was concerning for early hypoxic/ischemic encephalopathy  predominantly involving the left MCA territory.  Shortly after return from CT patient became bradycardic which progressed to PEA.  Patient's wife and daughter were brought to bedside, and patient was pronounced.    Pertinent Labs and Studies  Significant Diagnostic Studies DG Abdomen 1  View  Result Date: May 26, 2019 CLINICAL DATA:  Enteric catheter placement, cardiac arrest EXAM: ABDOMEN - 1 VIEW COMPARISON:  None. FINDINGS: Frontal view of the left upper abdomen was performed. Tip and side port of an enteric catheter projects over the gastric fundus. Bowel gas pattern is unremarkable. IMPRESSION: 1. Enteric catheter projecting over gastric fundus. Electronically Signed   By: Sharlet Salina M.D.   On: 2019-05-26 22:50   CT HEAD WO CONTRAST  Result Date: May 26, 2019 CLINICAL DATA:  69 year old male with cardiac arrest. EXAM: CT HEAD WITHOUT CONTRAST TECHNIQUE: Contiguous axial images were obtained from the base of the skull through the vertex without intravenous contrast. COMPARISON:  None. FINDINGS: Evaluation is limited due to streak artifact caused by body habitus and overlying support wires as well as due to motion artifact. Brain: Mild age-related atrophy and chronic microvascular ischemic changes. Diffuse area of hypodensity involving the left hemisphere with loss of great white matter discrimination and indistinct deep brain nuclei structures including indistinction of the left basal ganglia most consistent with early ischemia. This is most prominent involving the left MCA territory. There is no acute intracranial hemorrhage. No mass effect or midline shift. No extra-axial fluid collection. Vascular: Suboptimally visualized. There is high attenuation of the MCA bilaterally which may represent hemoconcentration or be secondary to diffusely decreased attenuation of the brain parenchyma. Skull: Normal. Negative for fracture or focal lesion. Sinuses/Orbits: There is diffuse mucoperiosteal thickening of paranasal sinuses. No air-fluid level. The mastoid air cells are clear. Other: Large left parietal scalp hematoma. IMPRESSION: 1. No acute intracranial hemorrhage. 2. 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  Clinical correlation is recommended. These results were called by telephone at the time of interpretation on 2019-05-26 at 10:45 pm to Dr. Silverio Lay, who verbally acknowledged these results. Electronically Signed   By: Elgie Collard M.D.   On: 05-26-2019 22:50   DG Chest Portable 1 View  Result Date: May 26, 2019 CLINICAL DATA:  Intubated, central line placement EXAM: PORTABLE CHEST 1 VIEW COMPARISON:  None. FINDINGS: 2 frontal views of the chest are obtained, limited by portable technique, supine position, and patient body habitus. Endotracheal tube overlies tracheal air column, tip projecting approximately 1.3 cm above carina. Right internal jugular catheter tip overlies right brachiocephalic confluence. There are abandoned epicardial pacing wires. 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 The heart is enlarged. There is diffuse ground-glass airspace disease and bilateral vascular congestion consistent with pulmonary edema. No large effusion or pneumothorax. IMPRESSION: 1. Support devices as above. 2. Findings consistent with congestive heart failure and pulmonary edema. Electronically Signed   By: Sharlet Salina M.D.   On: 26-May-2019 19:23    Microbiology Recent Results (from the past 240 hour(s))  Respiratory Panel by RT PCR (Flu A&B, Covid) - Nasopharyngeal Swab     Status: None   Collection Time: 05/26/19  6:49 PM   Specimen: Nasopharyngeal Swab  Result Value Ref Range Status   SARS Coronavirus 2 by RT PCR NEGATIVE NEGATIVE Final    Comment: (NOTE) SARS-CoV-2 target nucleic acids are NOT DETECTED. The SARS-CoV-2 RNA is generally detectable in upper respiratoy specimens during the acute phase of infection. The lowest concentration of SARS-CoV-2 viral  copies this assay can detect is 131 copies/mL. A negative result does not preclude SARS-Cov-2 infection and should not be used as the sole basis for treatment or other patient management decisions. A negative result may occur with  improper  specimen collection/handling, submission of specimen other than nasopharyngeal swab, presence of viral mutation(s) within the areas targeted by this assay, and inadequate number of viral copies (<131 copies/mL). A negative result must be combined with clinical observations, patient history, and epidemiological information. The expected result is Negative. Fact Sheet for Patients:  PinkCheek.be Fact Sheet for Healthcare Providers:  GravelBags.it This test is not yet ap proved or cleared by the [place] FDA and  has been authorized for detection and/or diagnosis of SARS-CoV-2 by FDA under an Emergency Use Authorization (EUA). This EUA will remain  in effect (meaning this test can be used) for the duration of the COVID-19 declaration under Section 564(b)(1) of the Act, 21 U.S.C. section 360bbb-3(b)(1), unless the authorization is terminated or revoked sooner.    Influenza A by PCR NEGATIVE NEGATIVE Final   Influenza B by PCR NEGATIVE NEGATIVE Final    Comment: (NOTE) The Xpert Xpress SARS-CoV-2/FLU/RSV assay is intended as an aid in  the diagnosis of influenza from Nasopharyngeal swab specimens and  should not be used as a sole basis for treatment. Nasal washings and  aspirates are unacceptable for Xpert Xpress SARS-CoV-2/FLU/RSV  testing. Fact Sheet for Patients: PinkCheek.be Fact Sheet for Healthcare Providers: GravelBags.it This test is not yet approved or cleared by the [place] FDA and  has been authorized for detection and/or diagnosis of SARS-CoV-2 by  FDA under an Emergency Use Authorization (EUA). This EUA will remain  in effect (meaning this test can be used) for the duration of the  Covid-19 declaration under Section 564(b)(1) of the Act, 21  U.S.C. section 360bbb-3(b)(1), unless the authorization is  terminated or revoked. Performed at [facility], [address] [place], [place] [address]     Lab Basic Metabolic Panel: Recent Labs  Lab 04-30-2019 1849 04/30/19 2030  NA 139 142  K 4.1 4.7  CL 106 106  CO2 14* 15*  GLUCOSE 260* 323*  BUN 12 13  CREATININE 1.13 1.47*  CALCIUM 9.1 8.7*   Liver Function Tests: Recent Labs  Lab 30-Apr-2019 1849  AST 52*  ALT 32  ALKPHOS 105  BILITOT 1.1  PROT 7.3  ALBUMIN 3.8   No results for input(s): LIPASE, AMYLASE in the last 168 hours. No results for input(s): AMMONIA in the last 168 hours. CBC: Recent Labs  Lab 04/30/19 1849  WBC 15.7*  NEUTROABS 8.0*  HGB 14.3  HCT 47.6  MCV 96.0  PLT 217   Cardiac Enzymes: No results for input(s): CKTOTAL, CKMB, CKMBINDEX, TROPONINI in the last 168 hours. Sepsis Labs: Recent Labs  Lab 04/30/2019 1849 30-Apr-2019 2030  PROCALCITON  --  2.44  WBC 15.7*  --   LATICACIDVEN  --  >11.0*    Procedures/Operations  Endotracheal intubation 4/17 Right IJ CVC insertion 4/17      Darel Hong, [facility] Pager: [phone]  Bradly Bienenstock 04/30/19, 11:19 PM

## 2019-05-11 NOTE — ED Notes (Signed)
Jeri Modena NP at bedside and is going to talk to family.

## 2019-05-11 NOTE — ED Notes (Signed)
Amp of epinephrine administered by Shanda Bumps RN and Atropine 1mg  administered by same RN.

## 2019-05-11 NOTE — ED Notes (Addendum)
5000 U Heparin administered in right hand IV per Dr. Ezequiel Essex verbal order.

## 2019-05-11 NOTE — ED Notes (Signed)
1 amp Epi given via IO  By [employer]

## 2019-05-11 NOTE — ED Notes (Signed)
Patient remains unresponsive to any interventions. Patient is not reactive to pain. Norepi switched to central line.

## 2019-05-11 DEATH — deceased

## 2021-08-07 IMAGING — CT CT HEAD W/O CM
3 series · 15 of 47 positions shown, 18 images · non-contrast
Comparison: None.

CLINICAL DATA: 68-year-old male with cardiac arrest.

EXAM:
CT HEAD WITHOUT CONTRAST
TECHNIQUE: Contiguous axial images were obtained from the base of the skull
through the vertex without intravenous contrast.

[Series 2: head wo · axial · 0.46mm/px · z∈[-187,-57]mm · 9 of 32 slices shown, 12 images]
[im 3/32  brain]
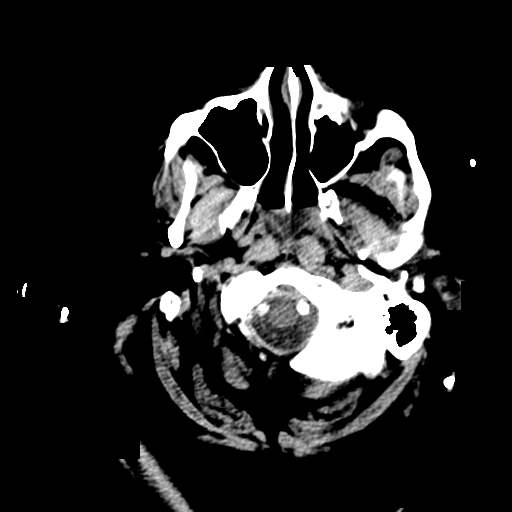
[im 3/32  bone]
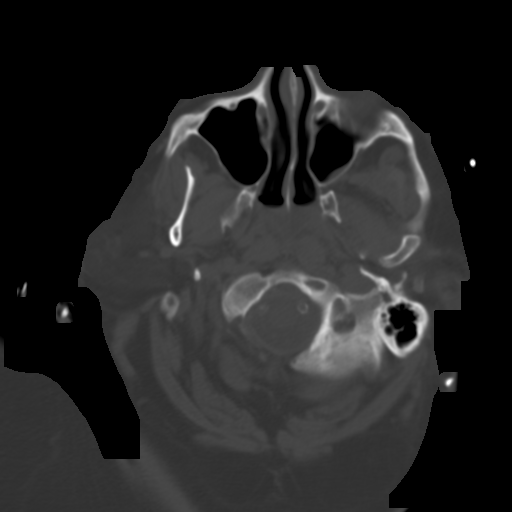
[im 6/32  brain]
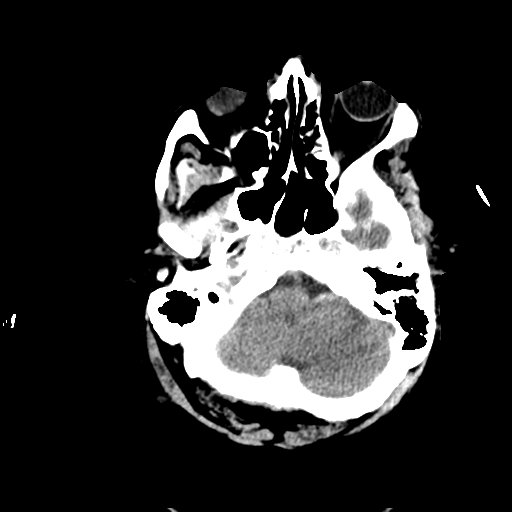
[im 9/32  brain]
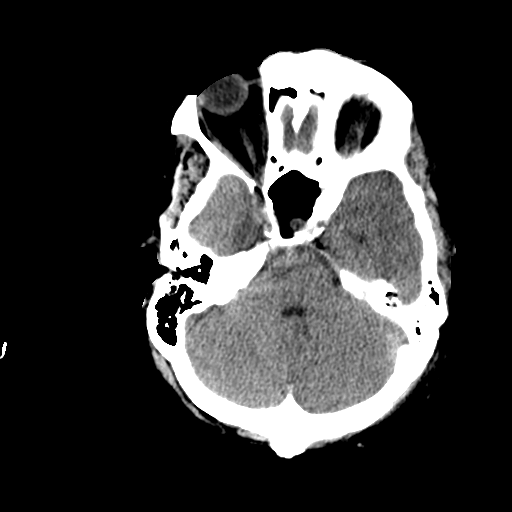
[im 12/32  brain]
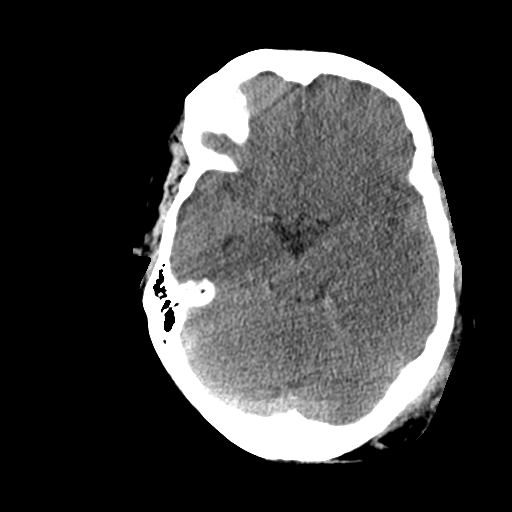
[im 17/32  brain]
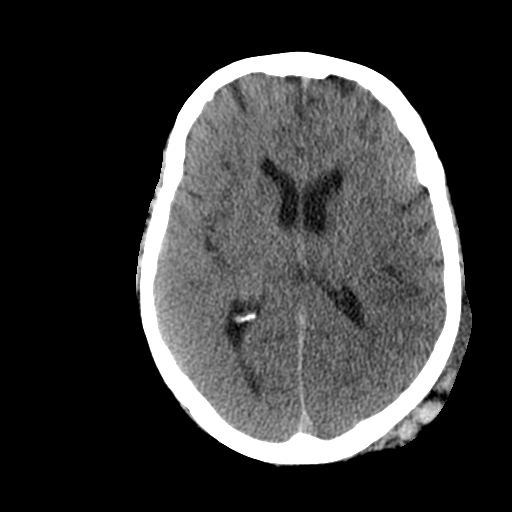
[im 17/32  bone]
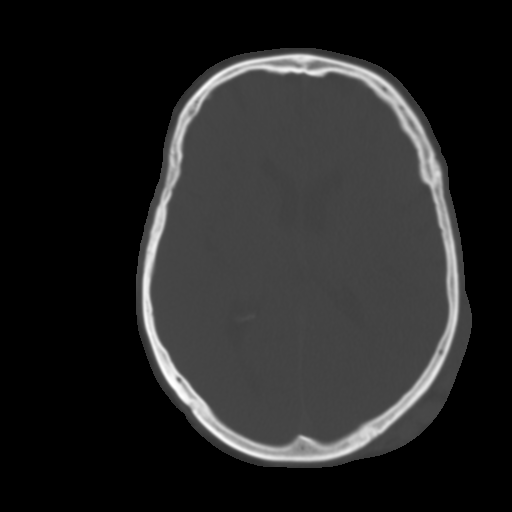
[im 20/32  brain]
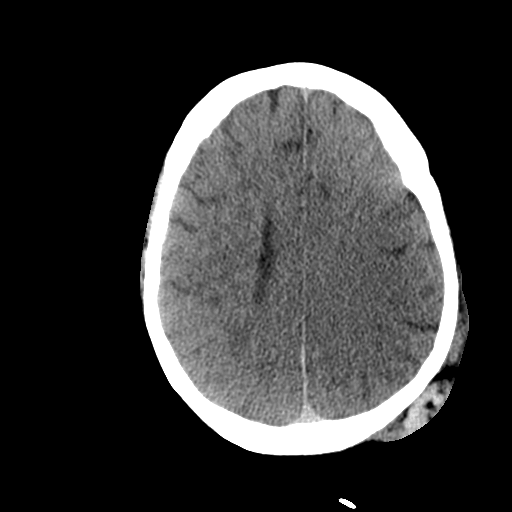
[im 23/32  brain]
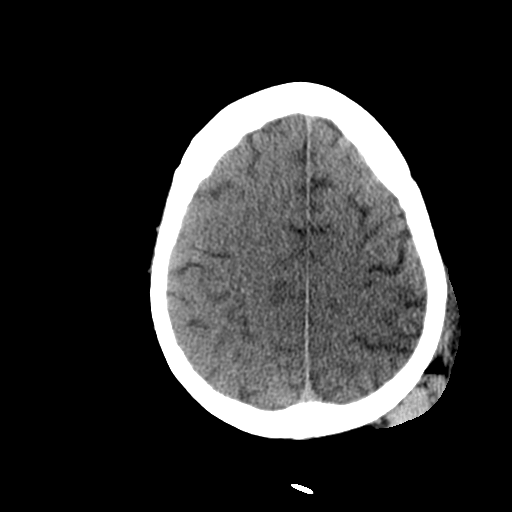
[im 26/32  brain]
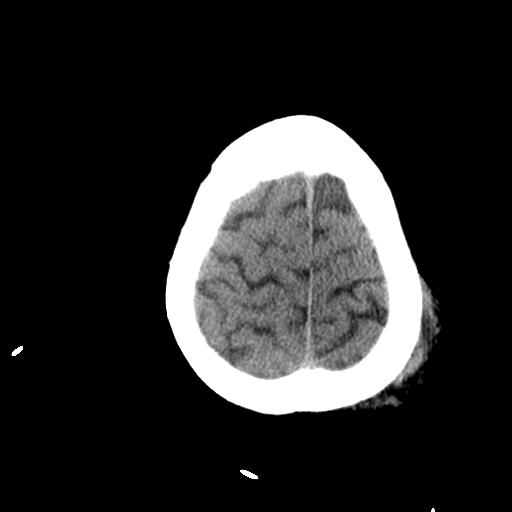
[im 29/32  brain]
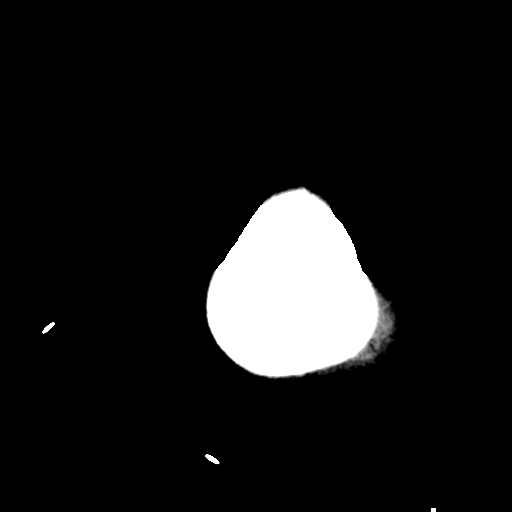
[im 29/32  bone]
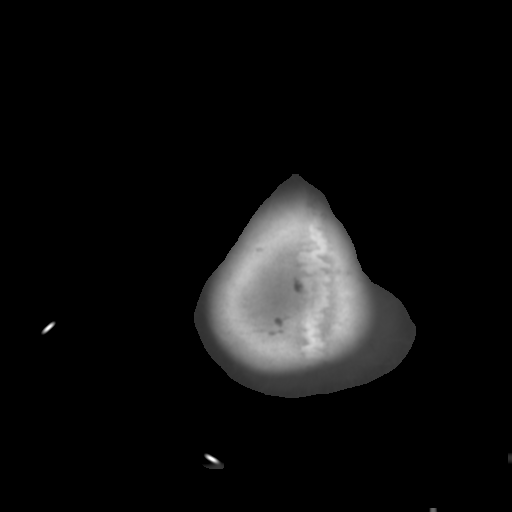

[Series 4: coronal soft tissue · coronal · 0.32mm/px · 3 of 64 slices shown]
[im 22/64  brain]
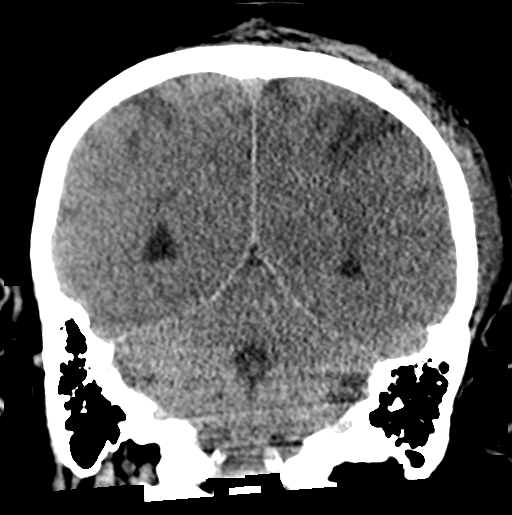
[im 29/64  brain]
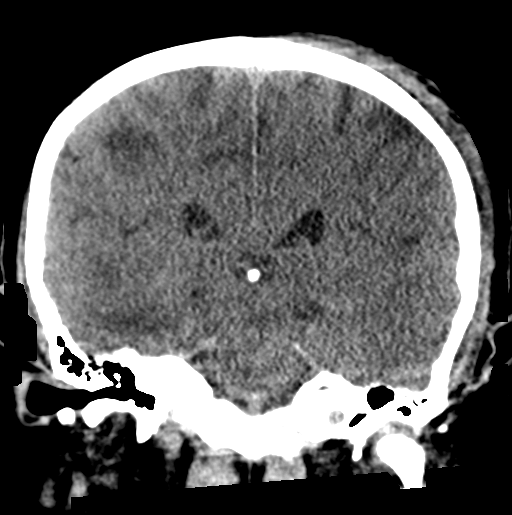
[im 36/64  brain]
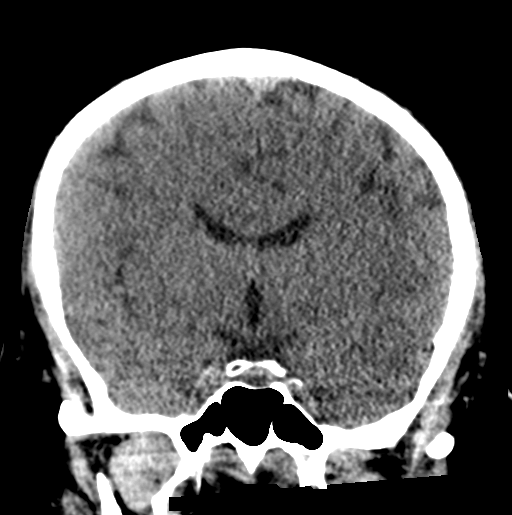

[Series 5: sagittal soft tissue · sagittal · 0.33mm/px · 3 of 52 slices shown]
[im 18/52  brain]
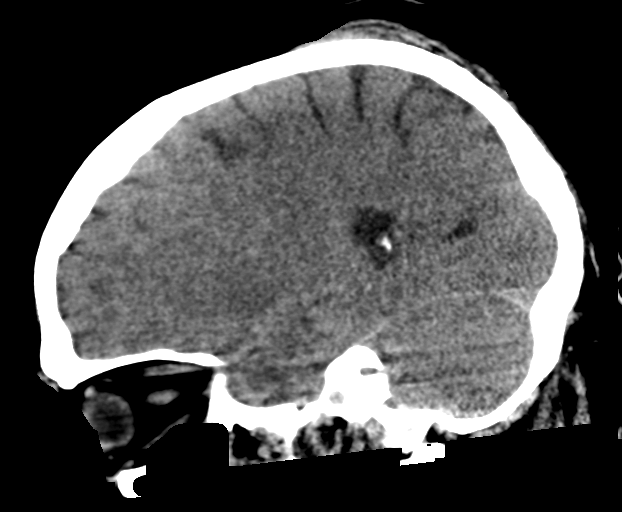
[im 26/52  brain]
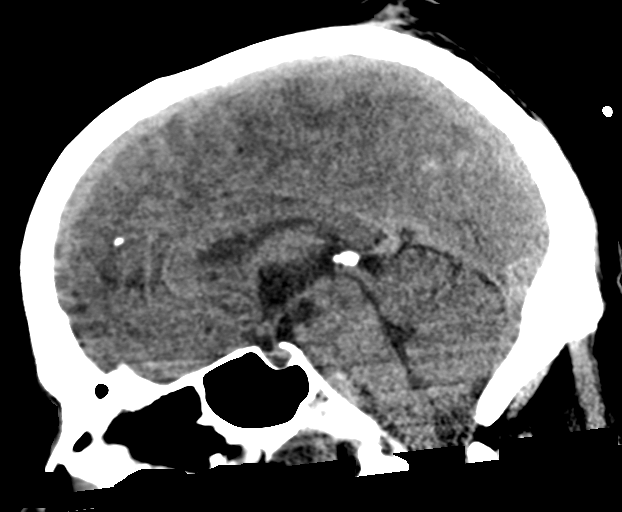
[im 35/52  brain]
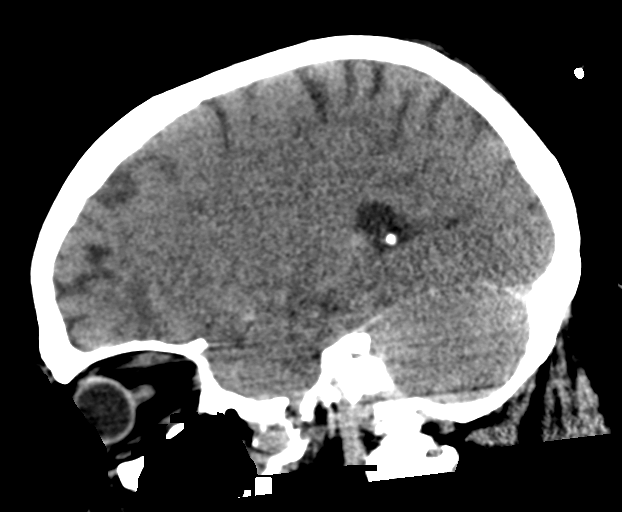

[15 of 47 positions shown; findings below may reference images not displayed]

FINDINGS: Evaluation is limited due to streak artifact caused by body habitus
and overlying support wires as well as due to motion artifact.

Brain: Mild age-related atrophy and chronic microvascular ischemic
changes. Diffuse area of hypodensity involving the left hemisphere
with loss of great white matter discrimination and indistinct deep
brain nuclei structures including indistinction of the left basal
ganglia most consistent with early ischemia. This is most prominent
involving the left MCA territory. There is no acute intracranial
hemorrhage. No mass effect or midline shift. No extra-axial fluid
collection.

Vascular: Suboptimally visualized. There is high attenuation of the
MCA bilaterally which may represent hemoconcentration or be
secondary to diffusely decreased attenuation of the brain
parenchyma.

Skull: Normal. Negative for fracture or focal lesion.

Sinuses/Orbits: There is diffuse mucoperiosteal thickening of
paranasal sinuses. No air-fluid level. The mastoid air cells are
clear.

Other: Large left parietal scalp hematoma.
IMPRESSION: 1. No acute intracranial hemorrhage.
2. Early hypoxic-ischemic encephalopathy predominantly involving the
left MCA territory versus left MCA acute ischemic infarct. Clinical
correlation is recommended.

These results were called by telephone at the time of interpretation
on 04/27/2019 at [DATE] to Dr. Don Lolito, who verbally acknowledged these
results.

## 2021-08-07 IMAGING — DX DG CHEST 1V PORT
2 series · 2 of 2 positions shown · non-contrast
Comparison: None.

CLINICAL DATA: Intubated, central line placement

EXAM:
PORTABLE CHEST 1 VIEW

[chest ap (1 of 2)]
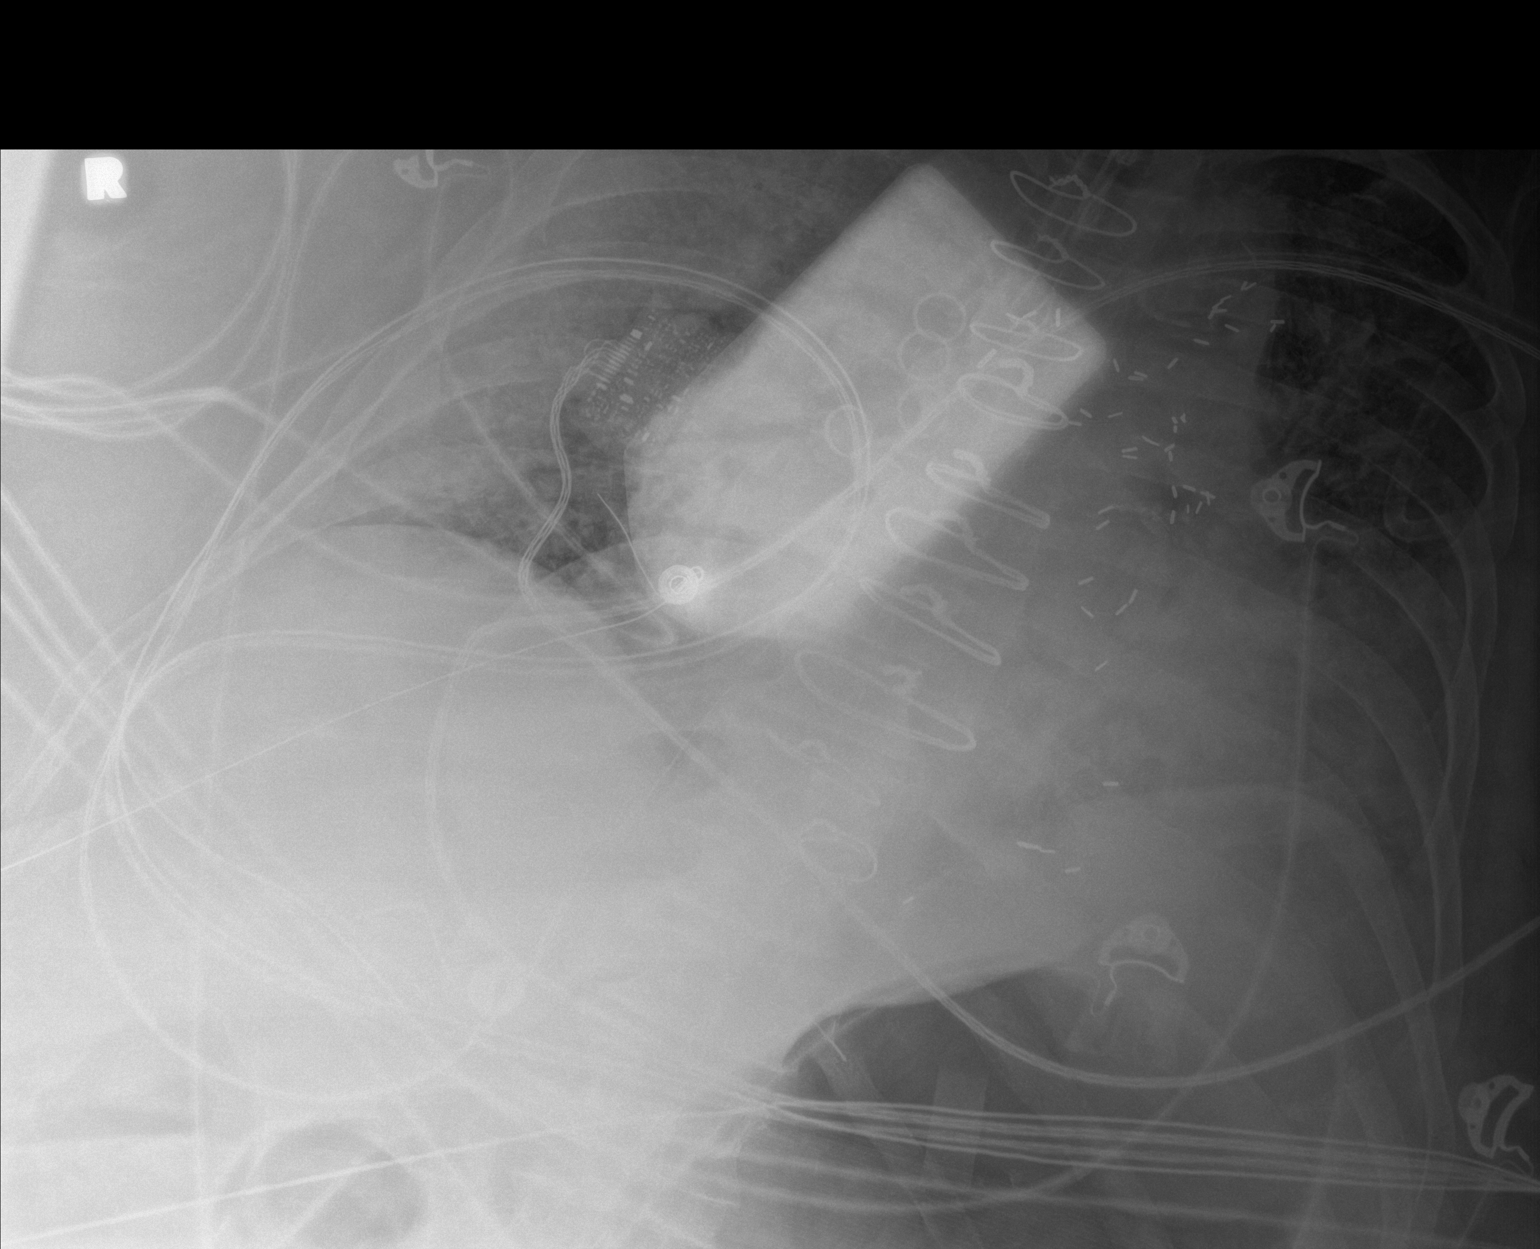

[chest ap (2 of 2)]
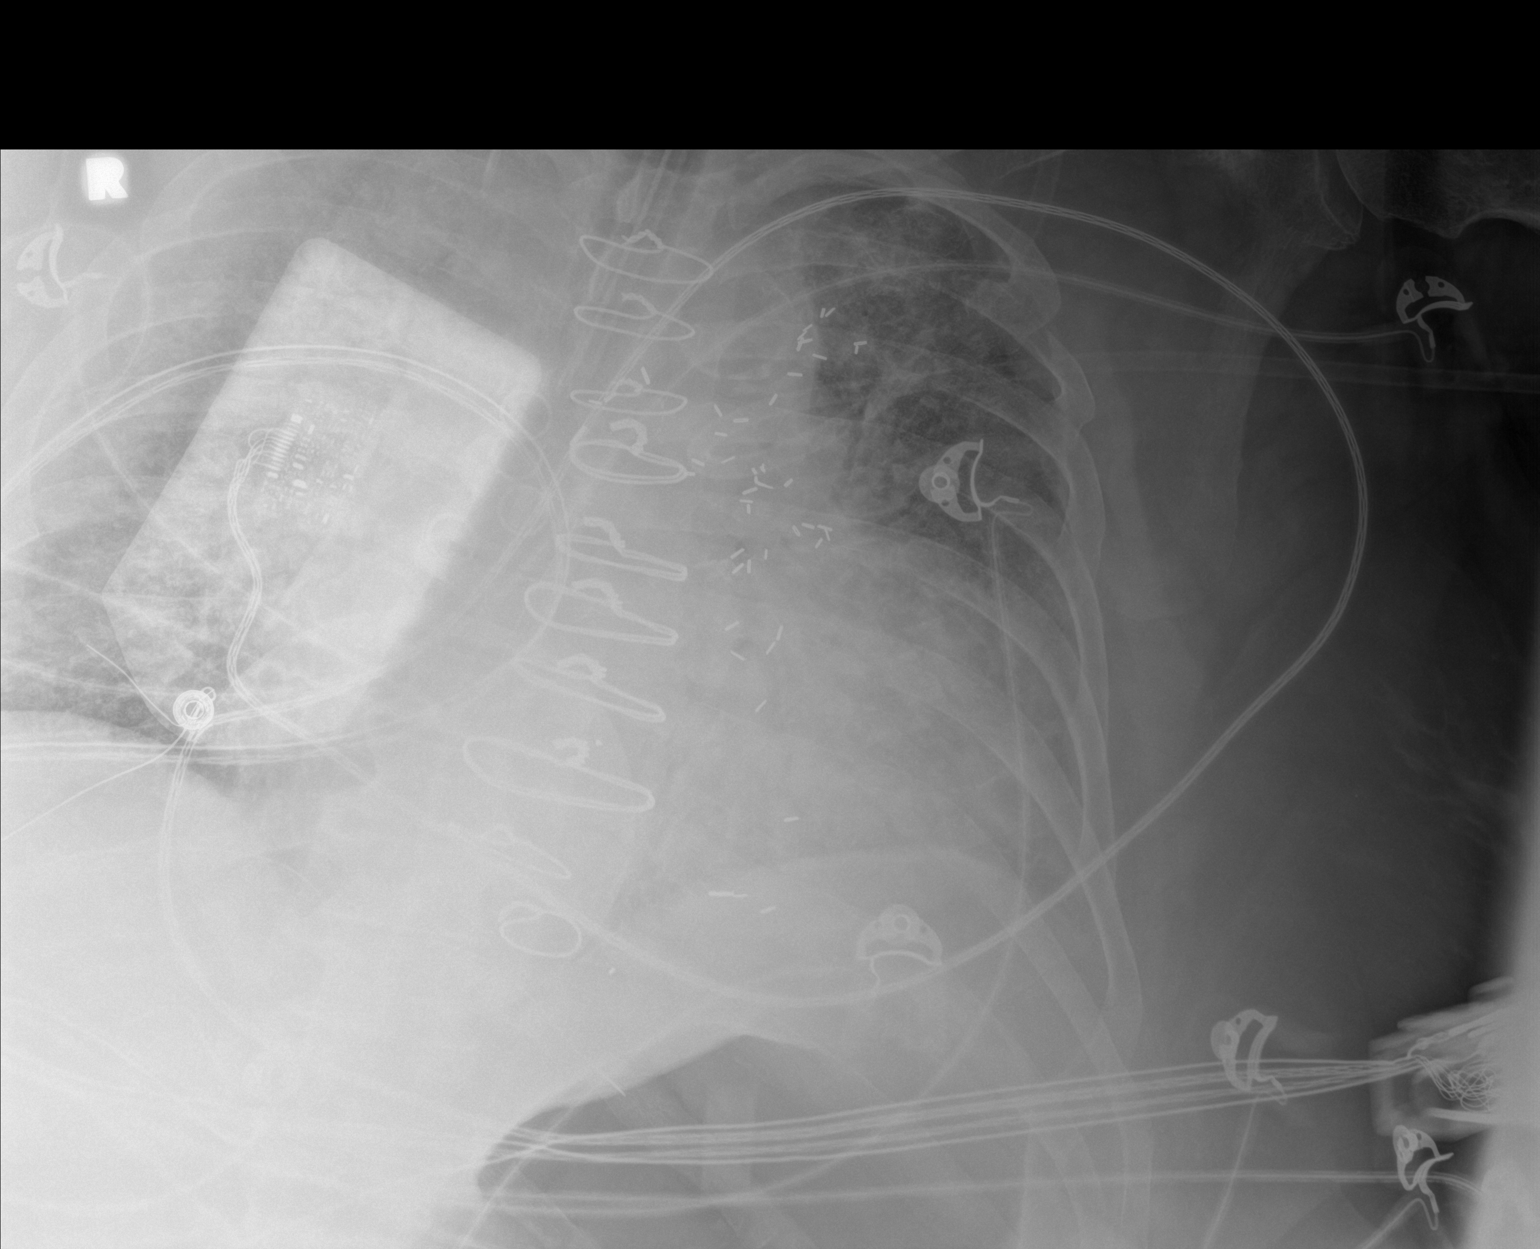

[2 of 2 positions shown; findings below may reference images not displayed]

FINDINGS: 2 frontal views of the chest are obtained, limited by portable
technique, supine position, and patient body habitus. Endotracheal
tube overlies tracheal air column, tip projecting approximately
cm above carina. Right internal jugular catheter tip overlies right
brachiocephalic confluence. There are abandoned epicardial pacing
wires. Postsurgical changes from prior bypass surgery.

The heart is enlarged. There is diffuse ground-glass airspace
disease and bilateral vascular congestion consistent with pulmonary
edema. No large effusion or pneumothorax.
IMPRESSION: 1. Support devices as above.
2. Findings consistent with congestive heart failure and pulmonary
edema.
